# Patient Record
Sex: Male | Born: 1975 | Hispanic: Yes | Marital: Single | Smoking: Current every day smoker
Health system: Southern US, Community
[De-identification: ages and names within clinical notes are randomized; demographics above are authoritative.]

---

## 2019-10-11 ENCOUNTER — Encounter (HOSPITAL_BASED_OUTPATIENT_CLINIC_OR_DEPARTMENT_OTHER): Payer: Self-pay | Admitting: *Deleted

## 2019-10-11 ENCOUNTER — Emergency Department (HOSPITAL_BASED_OUTPATIENT_CLINIC_OR_DEPARTMENT_OTHER): Payer: Self-pay

## 2019-10-11 ENCOUNTER — Emergency Department (HOSPITAL_BASED_OUTPATIENT_CLINIC_OR_DEPARTMENT_OTHER)
Admission: EM | Admit: 2019-10-11 | Discharge: 2019-10-11 | Disposition: A | Discharge status: Home / Self Care | Payer: Self-pay | Attending: Emergency Medicine | Admitting: Emergency Medicine

## 2019-10-11 ENCOUNTER — Other Ambulatory Visit: Payer: Self-pay

## 2019-10-11 DIAGNOSIS — F17200 Nicotine dependence, unspecified, uncomplicated: Secondary | ICD-10-CM | POA: Insufficient documentation

## 2019-10-11 DIAGNOSIS — W108XXA Fall (on) (from) other stairs and steps, initial encounter: Secondary | ICD-10-CM | POA: Insufficient documentation

## 2019-10-11 DIAGNOSIS — S2231XA Fracture of one rib, right side, initial encounter for closed fracture: Secondary | ICD-10-CM | POA: Insufficient documentation

## 2019-10-11 DIAGNOSIS — Y9301 Activity, walking, marching and hiking: Secondary | ICD-10-CM | POA: Insufficient documentation

## 2019-10-11 MED ORDER — METHOCARBAMOL 500 MG PO TABS: 500.0000 mg | ORAL_TABLET | Freq: Three times a day (TID) | ORAL | 0 refills | Status: AC | PRN

## 2019-10-11 MED ORDER — HYDROCODONE-ACETAMINOPHEN 5-325 MG PO TABS
2.0000 | ORAL_TABLET | ORAL | 0 refills | Status: AC | PRN
Start: 2019-10-11 — End: ?

## 2019-10-11 MED ORDER — NAPROXEN 375 MG PO TABS: 375.0000 mg | ORAL_TABLET | Freq: Two times a day (BID) | ORAL | 0 refills | Status: AC

## 2019-10-11 MED FILL — HYDROCODON-APAP 5-325: 5-325 | 2 days supply | Qty: 10 | Fill #0

## 2019-10-11 MED FILL — METHOCARBAMOL 500 MG TABS: 500 | 6 days supply | Qty: 20 | Fill #0

## 2019-10-11 MED FILL — NAPROXEN 375 MG TABLET: 375 | 10 days supply | Qty: 20 | Fill #0

## 2019-10-11 NOTE — Discharge Instructions (Signed)
Contact a health care provider if: You have a fever. Get help right away if: You have difficulty breathing or you are short of breath. You develop a cough that does not stop, or you cough up thick or bloody sputum. You have nausea, vomiting, or pain in your abdomen. Your pain gets worse and medicine does not help. 

## 2019-10-11 NOTE — ED Notes (Addendum)
Incentive spirometry teaching, pt verbalizes understanding, demonstrated technique, teach back method

## 2019-10-11 NOTE — ED Notes (Signed)
Pt discharged to home. Discharge instructions have been discussed with patient and/or family members. Pt verbally acknowledges understanding d/c instructions, and endorses comprehension to checkout at registration before leaving.  °

## 2019-10-11 NOTE — ED Triage Notes (Signed)
Fall down stairs 2 days ago with injury to his right ribs.

## 2019-10-11 NOTE — ED Provider Notes (Signed)
[facility] EMERGENCY DEPARTMENT Provider Note   CSN: [id number] Arrival date & time: 10/11/19  1102     History Chief Complaint  Patient presents with  . Fall  . Rib Injury    Brandon Malone is a 44 y.o. male who presents emergency department with chief complaint of rib pain.  Patient states that 2 nights ago he [place] and [place] several stairs on his back.  Since that time he has had pain in his right lowe rib cage.  Patient states that since that time he has had pain when he moves, twists, or takes deep breath.  He describes the pain as sharp.  He has tried naproxen without relief.  He denies cough, head injury, loss of consciousness, hemoptysis.  HPI     History reviewed. No pertinent past medical history.  There are no problems to display for this patient.   History reviewed. No pertinent surgical history.     No family history on file.  Social History   Tobacco Use  . Smoking status: Current Every Day Smoker  . Smokeless tobacco: Never Used  Substance Use Topics  . Alcohol use: Yes  . Drug use: Never    Home Medications Prior to Admission medications   Medication Sig Start Date End Date Taking? Authorizing Provider  HYDROcodone-acetaminophen (NORCO) 5-325 MG tablet Take 2 tablets by mouth every 4 (four) hours as needed. 10/11/19   Arthor Captain, PA-C  methocarbamol (ROBAXIN) 500 MG tablet Take 1 tablet (500 mg total) by mouth 3 (three) times daily as needed for muscle spasms. 10/11/19   Johntavious Francom, Cammy Copa, PA-C  naproxen (NAPROSYN) 375 MG tablet Take 1 tablet (375 mg total) by mouth 2 (two) times daily. 10/11/19   Arthor Captain, PA-C    Allergies    Patient has no known allergies.  Review of Systems   Review of Systems Ten systems reviewed and are negative for acute change, except as noted in the HPI.   Physical Exam Updated Vital Signs Ht 5\' 10"  (1.778 m)   Wt 81.2 kg   BMI 25.68 kg/m   Physical  Exam Vitals and nursing note reviewed.  Constitutional:      General: He is not in acute distress.    Appearance: He is well-developed. He is not diaphoretic.  HENT:     Head: Normocephalic and atraumatic.  Eyes:     General: No scleral icterus.    Conjunctiva/sclera: Conjunctivae normal.  Cardiovascular:     Rate and Rhythm: Normal rate and regular rhythm.     Heart sounds: Normal heart sounds.  Pulmonary:     Effort: Pulmonary effort is normal. No respiratory distress.     Breath sounds: Normal breath sounds.  Chest:     Comments: Point tenderness over the right lower rib cage posteriorly Abdominal:     Palpations: Abdomen is soft.     Tenderness: There is no abdominal tenderness.  Musculoskeletal:     Cervical back: Normal range of motion and neck supple.  Skin:    General: Skin is warm and dry.  Neurological:     Mental Status: He is alert.  Psychiatric:        Behavior: Behavior normal.     ED Results / Procedures / Treatments   Labs (all labs ordered are listed, but only abnormal results are displayed) Labs Reviewed - No data to display  EKG None  Radiology DG Ribs Unilateral W/Chest Right  Result Date: 10/11/2019  CLINICAL DATA:  Fall 2 days ago with right lower posterior rib pain. EXAM: RIGHT RIBS AND CHEST - 3+ VIEW COMPARISON:  03/23/2017 FINDINGS: Lungs are adequately inflated and otherwise clear. Cardiomediastinal silhouette is normal. Suggestion of a nondisplaced fracture involving the right posterior tenth rib. IMPRESSION: 1. No acute cardiopulmonary disease. 2. Nondisplaced fracture right posterior tenth rib. Electronically Signed   By: Elberta Fortis M.D.   On: 10/11/2019 11:49    Procedures Procedures (including critical care time)  Medications Ordered in ED Medications - No data to display  ED Course  I have reviewed the triage vital signs and the nursing notes.  Pertinent labs & imaging results that were available during my care of the patient  were reviewed by me and considered in my medical decision making (see chart for details).    MDM Rules/Calculators/A&P                          Patient with 10th rib fracture on x-ray.  I personally reviewed the images.  Patient will be discharged with definitive fracture care including incentive spirometer, pain control.  Discussed outpatient follow-up and return precautions. Final Clinical Impression(s) / ED Diagnoses Final diagnoses:  Closed fracture of one rib of right side, initial encounter    Rx / DC Orders ED Discharge Orders         Ordered    HYDROcodone-acetaminophen (NORCO) 5-325 MG tablet  Every 4 hours PRN        10/11/19 1354    naproxen (NAPROSYN) 375 MG tablet  2 times daily        10/11/19 1354    methocarbamol (ROBAXIN) 500 MG tablet  3 times daily PRN        10/11/19 1354           Arthor Captain, PA-C 10/11/19 1410    Linwood Dibbles, MD 10/12/19 [phone]

## 2021-06-16 ENCOUNTER — Emergency Department (HOSPITAL_COMMUNITY)
Admission: EM | Admit: 2021-06-16 | Discharge: 2021-06-16 | Disposition: A | Discharge status: Home / Self Care | Payer: Self-pay | Attending: Emergency Medicine | Admitting: Emergency Medicine

## 2021-06-16 ENCOUNTER — Encounter (HOSPITAL_COMMUNITY): Payer: Self-pay

## 2021-06-16 DIAGNOSIS — S50862A Insect bite (nonvenomous) of left forearm, initial encounter: Secondary | ICD-10-CM | POA: Diagnosis not present

## 2021-06-16 DIAGNOSIS — S20169A Insect bite (nonvenomous) of breast, unspecified breast, initial encounter: Secondary | ICD-10-CM | POA: Insufficient documentation

## 2021-06-16 DIAGNOSIS — R21 Rash and other nonspecific skin eruption: Secondary | ICD-10-CM

## 2021-06-16 DIAGNOSIS — S30860A Insect bite (nonvenomous) of lower back and pelvis, initial encounter: Secondary | ICD-10-CM | POA: Insufficient documentation

## 2021-06-16 DIAGNOSIS — W57XXXA Bitten or stung by nonvenomous insect and other nonvenomous arthropods, initial encounter: Secondary | ICD-10-CM | POA: Insufficient documentation

## 2021-06-16 MED ORDER — DOXYCYCLINE HYCLATE 100 MG PO CAPS: 100.0000 mg | ORAL_CAPSULE | Freq: Two times a day (BID) | ORAL | 0 refills | Status: AC

## 2021-06-16 NOTE — ED Provider Notes (Signed)
[facility]-EMERGENCY DEPT Provider Note   CSN: [id number] Arrival date & time: 06/16/21  1401     History  Chief Complaint  Patient presents with   Insect Bite    Brandon Malone is a 46 y.o. male.  Patient presents to the hospital complaining of multiple insect bites.  Patient states that he felt bites while at work on Thursday.  Friday morning he noticed red swollen areas where he felt the bites.  He has bites on the left forearm, left side of chest, and posterior left thigh.  He denies fevers, nausea, vomiting.  The patient states that they tried to pop the lesions and noticed either clear fluid or possibly a yellowish-white fluid from the wounds.  No relevant past medical history  HPI     Home Medications Prior to Admission medications   Medication Sig Start Date End Date Taking? Authorizing Provider  doxycycline (VIBRAMYCIN) 100 MG capsule Take 1 capsule (100 mg total) by mouth 2 (two) times daily. 06/16/21  Yes Darrick Grinder, PA-C  HYDROcodone-acetaminophen (NORCO) 5-325 MG tablet Take 2 tablets by mouth every 4 (four) hours as needed. 10/11/19   Arthor Captain, PA-C  methocarbamol (ROBAXIN) 500 MG tablet Take 1 tablet (500 mg total) by mouth 3 (three) times daily as needed for muscle spasms. 10/11/19   Harris, Cammy Copa, PA-C  naproxen (NAPROSYN) 375 MG tablet Take 1 tablet (375 mg total) by mouth 2 (two) times daily. 10/11/19   Arthor Captain, PA-C      Allergies    Patient has no known allergies.    Review of Systems   Review of Systems  Constitutional:  Negative for fever.  Gastrointestinal:  Negative for nausea and vomiting.  Skin:  Positive for rash.   Physical Exam Updated Vital Signs BP (!) 147/88 (BP Location: Right Arm)   Pulse 81   Temp 97.7 F (36.5 C) (Oral)   Resp 18   Ht 5\' 9"  (1.753 m)   Wt 83.9 kg   SpO2 99%   BMI 27.32 kg/m  Physical Exam Vitals and nursing note reviewed.  Constitutional:      General: He is not in  acute distress. HENT:     Head: Normocephalic and atraumatic.  Eyes:     Conjunctiva/sclera: Conjunctivae normal.  Cardiovascular:     Rate and Rhythm: Normal rate.  Pulmonary:     Effort: Pulmonary effort is normal.  Musculoskeletal:     Cervical back: Normal range of motion and neck supple.  Skin:    Findings: Lesion present.     Comments: Patient has erythematous lesion this.  1 on the left forearm, 1 in the left side of the chest near the nipple, and 2 on the posterior left buttock.  Lesions appear approximately 2.5 cm in size with punctate centers.  No drainage at this time  Neurological:     Mental Status: He is alert.    ED Results / Procedures / Treatments   Labs (all labs ordered are listed, but only abnormal results are displayed) Labs Reviewed - No data to display  EKG None  Radiology No results found.  Procedures Procedures    Medications Ordered in ED Medications - No data to display  ED Course/ Medical Decision Making/ A&P                           Medical Decision Making  Patient presents with concerns of insect bites with resultant  skin lesions.  Differential includes infected insect bites, cellulitis, abscess, ingrown hairs, and others  Ultrasound was used to evaluate lesions for possible drainage.  Minimal fluid collection, consider drainage but risk versus benefit does not seem worth incision.  Plan to treat this underlying infection with doxycycline.  The patient should use warm compresses at home.  We will schedule follow-up with your primary care urgent care within the next week for reevaluation.  Patient voices understanding with the plan and agrees.  Discharge home  Final Clinical Impression(s) / ED Diagnoses Final diagnoses:  Rash  Insect bite, unspecified site, initial encounter    Rx / DC Orders ED Discharge Orders          Ordered    doxycycline (VIBRAMYCIN) 100 MG capsule  2 times daily        06/16/21 1601               Pamala Duffel 06/16/21 1603    Mancel Bale, MD 06/16/21 2246

## 2021-06-16 NOTE — Discharge Instructions (Signed)
You were seen today due to a rash from possible insect bites.  As discussed, I have provided a prescription for an antibiotic.  Also recommend using warm compresses.  You should follow-up with a primary care provider or urgent care within the next week

## 2021-06-16 NOTE — ED Triage Notes (Signed)
Pt states that he noticed multiple insect bites that popped up after eating outside on Thursday. Pt has raised red spot on L forearm, L chest, L hamstring, L buttocks. Pt endorses drainage and itching from sites.

## 2021-12-16 IMAGING — CR DG RIBS W/ CHEST 3+V*R*
3 series · 3 of 3 positions shown · non-contrast
Comparison: 03/23/2017

CLINICAL DATA: Fall 2 days ago with right lower posterior rib pain.

EXAM:
RIGHT RIBS AND CHEST - 3+ VIEW

[w chest pa]
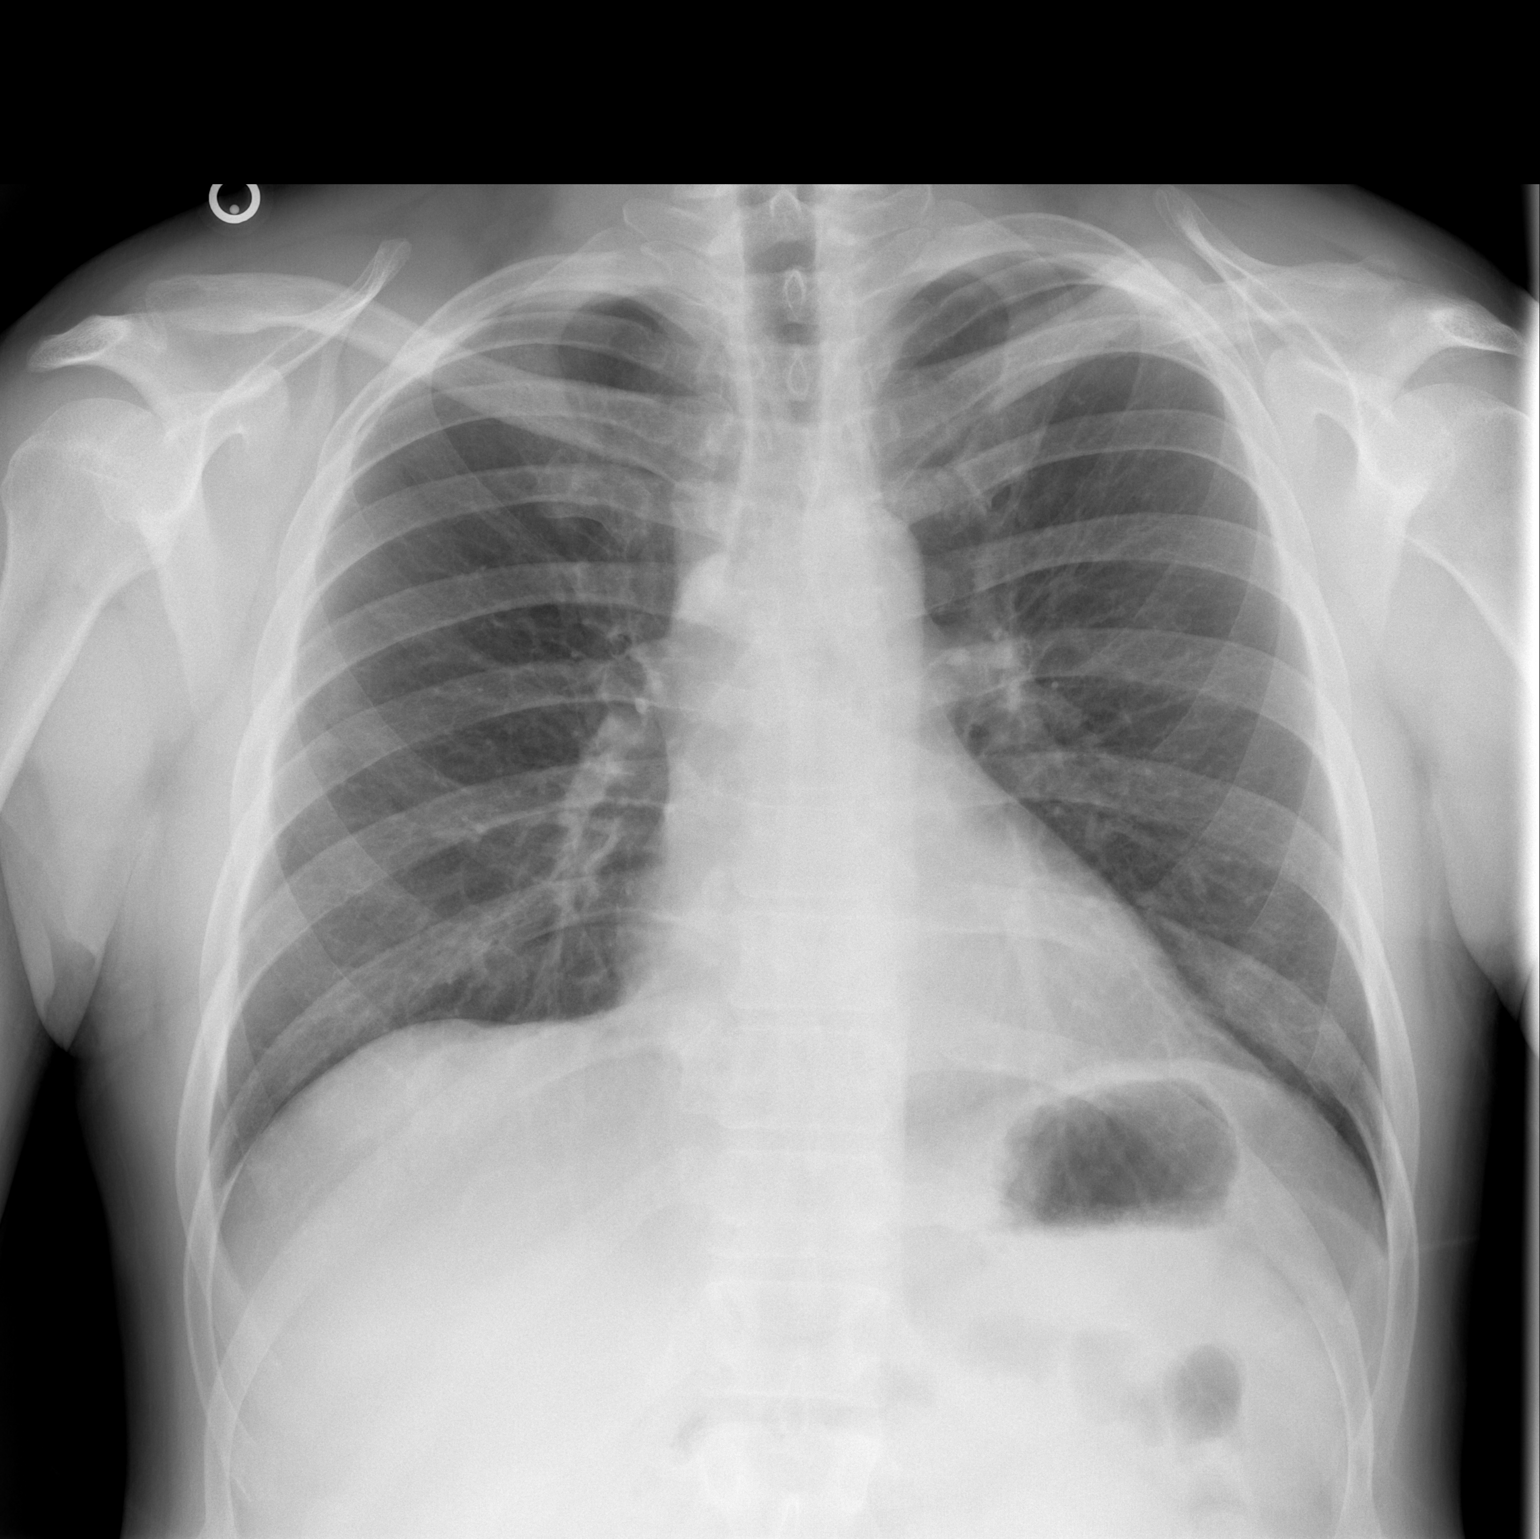

[w ribs ap/pa lower right *]
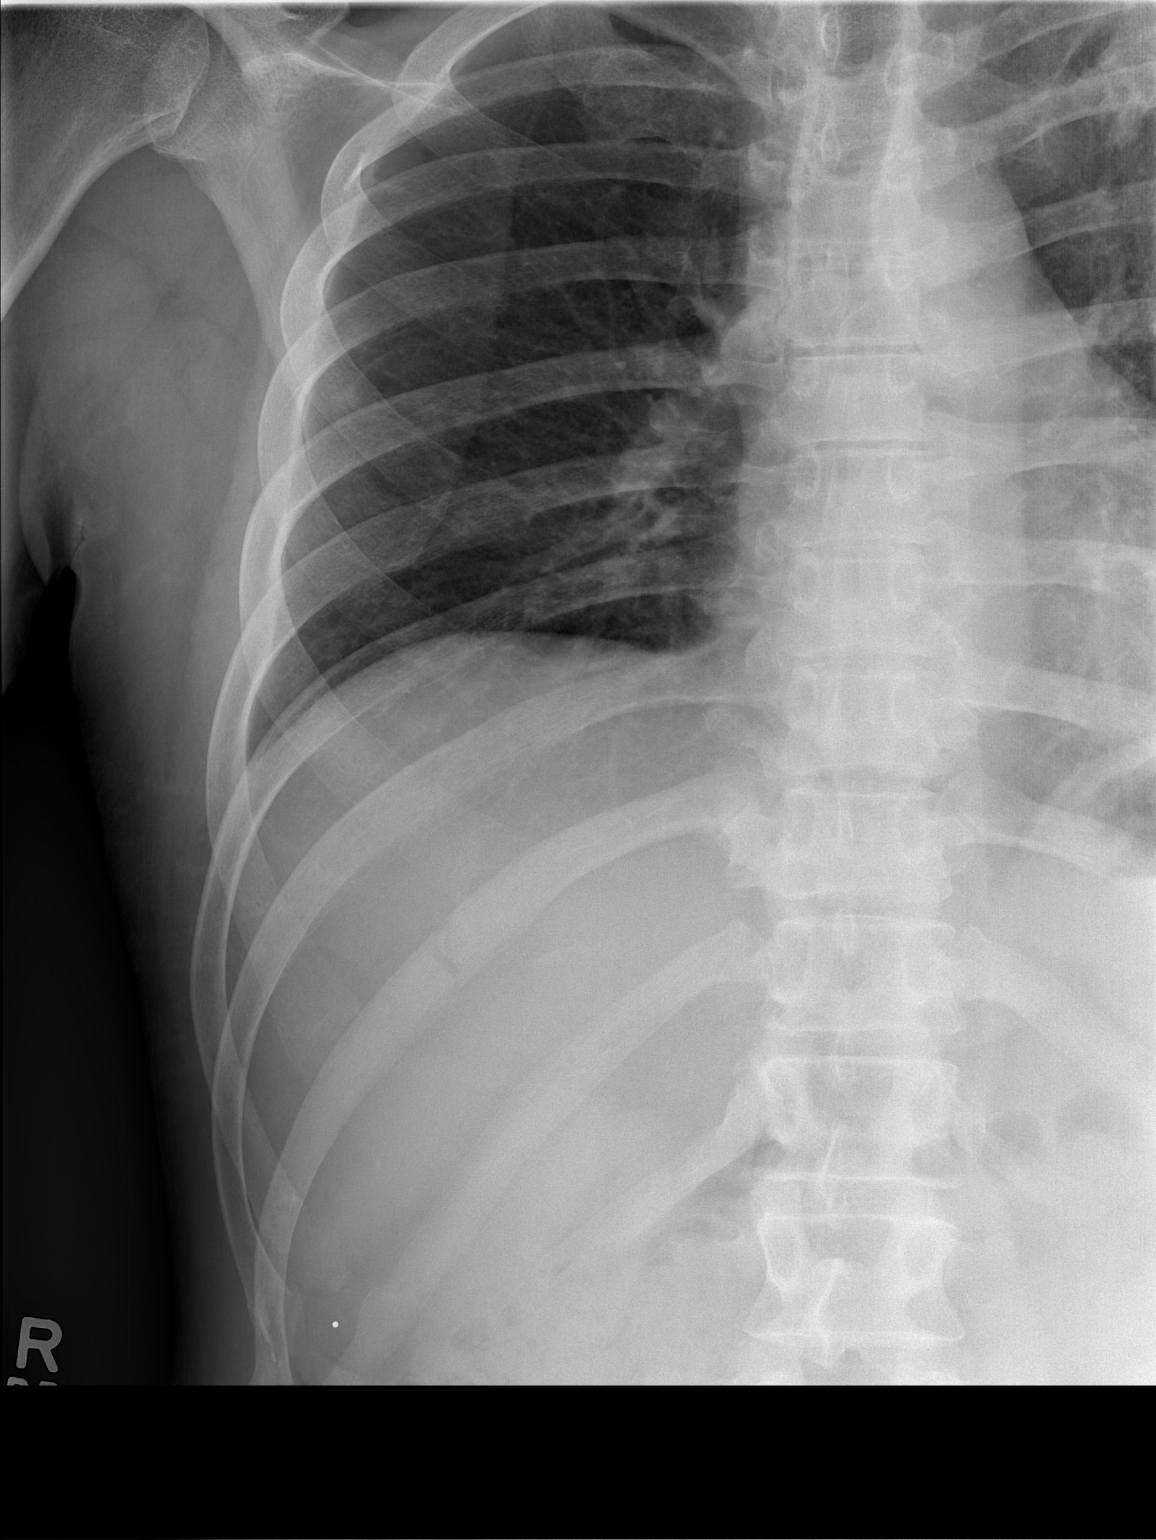

[w ribs oblique right *]
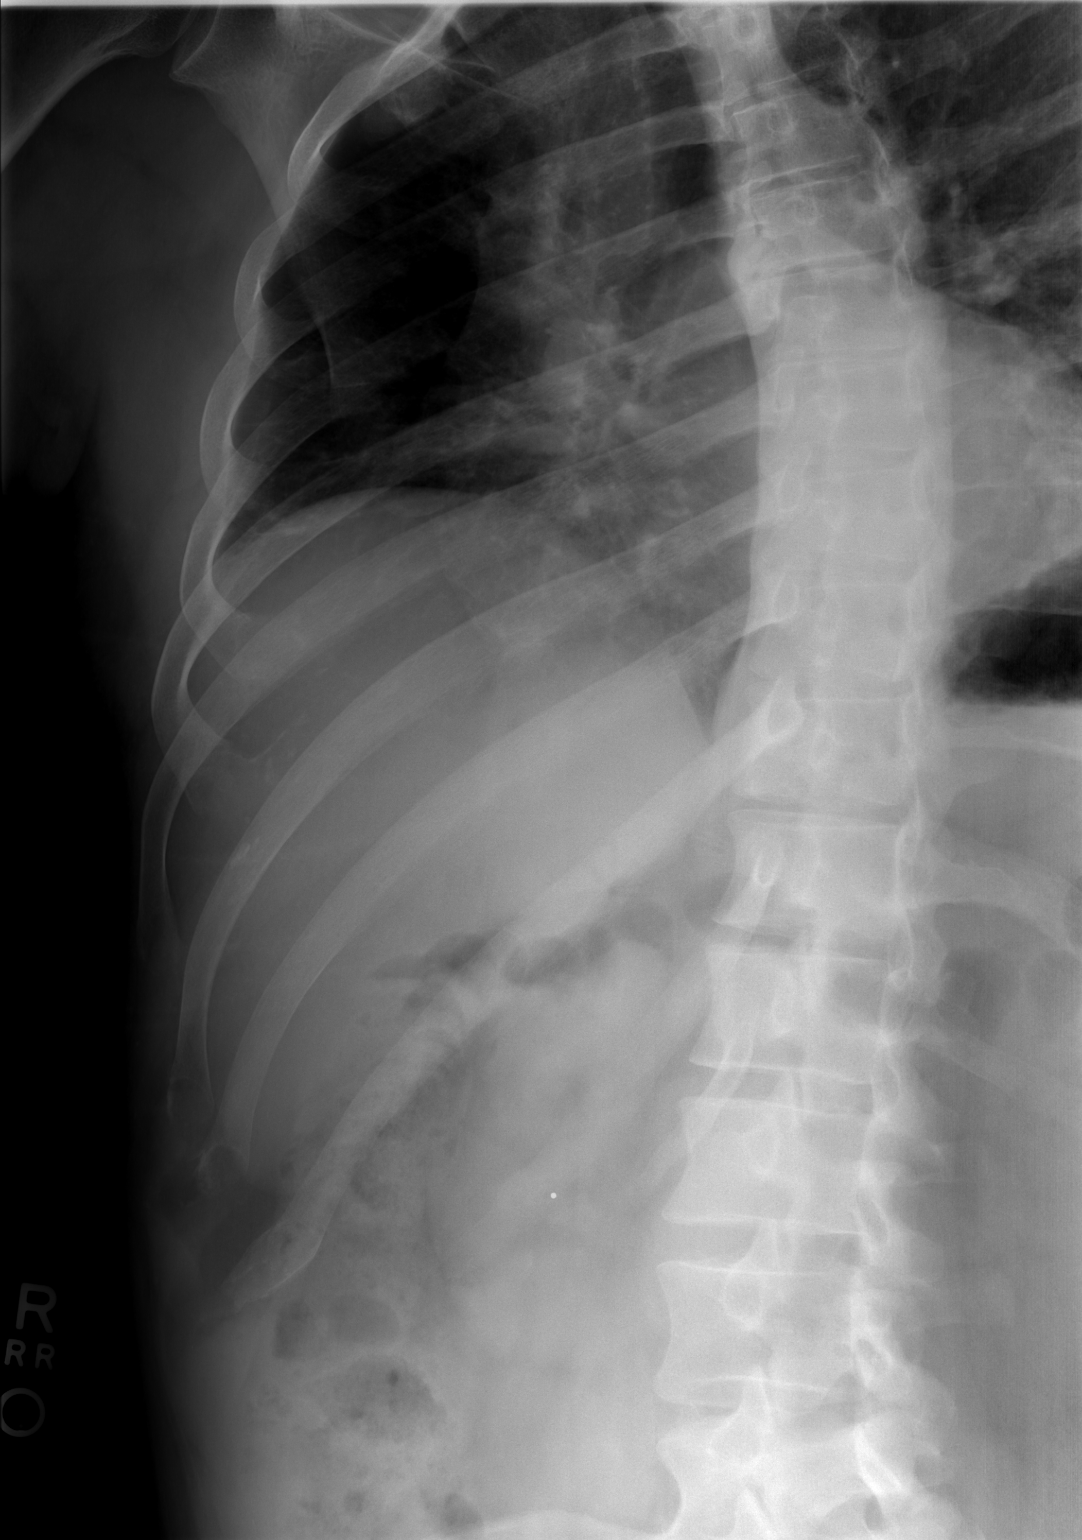

[3 of 3 positions shown; findings below may reference images not displayed]

FINDINGS: Lungs are adequately inflated and otherwise clear. Cardiomediastinal
silhouette is normal. Suggestion of a nondisplaced fracture
involving the right posterior tenth rib.
IMPRESSION: 1. No acute cardiopulmonary disease.
2. Nondisplaced fracture right posterior tenth rib.

## 2022-10-11 ENCOUNTER — Encounter (HOSPITAL_COMMUNITY): Payer: Self-pay

## 2022-10-11 ENCOUNTER — Emergency Department (HOSPITAL_COMMUNITY)
Admission: EM | Admit: 2022-10-11 | Discharge: 2022-10-11 | Discharge status: Against Medical Advice | Payer: Commercial Managed Care - HMO | Attending: Emergency Medicine | Admitting: Emergency Medicine

## 2022-10-11 DIAGNOSIS — Y92410 Unspecified street and highway as the place of occurrence of the external cause: Secondary | ICD-10-CM | POA: Diagnosis not present

## 2022-10-11 DIAGNOSIS — S01552A Open bite of oral cavity, initial encounter: Secondary | ICD-10-CM | POA: Diagnosis present

## 2022-10-11 DIAGNOSIS — R079 Chest pain, unspecified: Secondary | ICD-10-CM | POA: Diagnosis not present

## 2022-10-11 DIAGNOSIS — Z5321 Procedure and treatment not carried out due to patient leaving prior to being seen by health care provider: Secondary | ICD-10-CM | POA: Insufficient documentation

## 2022-10-11 LAB — CBC WITH DIFFERENTIAL/PLATELET
Abs Immature Granulocytes: 0.04 10*3/uL (ref 0.00–0.07)
Basophils Absolute: 0 10*3/uL (ref 0.0–0.1)
Basophils Relative: 0 %
Eosinophils Absolute: 0 10*3/uL (ref 0.0–0.5)
Eosinophils Relative: 0 %
HCT: 50.5 % (ref 39.0–52.0)
Hemoglobin: 16.8 g/dL (ref 13.0–17.0)
Immature Granulocytes: 0 %
Lymphocytes Relative: 10 %
Lymphs Abs: 1.1 10*3/uL (ref 0.7–4.0)
MCH: 29.3 pg (ref 26.0–34.0)
MCHC: 33.3 g/dL (ref 30.0–36.0)
MCV: 88 fL (ref 80.0–100.0)
Monocytes Absolute: 0.9 10*3/uL (ref 0.1–1.0)
Monocytes Relative: 8 %
Neutro Abs: 8.7 10*3/uL — ABNORMAL HIGH (ref 1.7–7.7)
Neutrophils Relative %: 82 %
Platelets: 384 10*3/uL (ref 150–400)
RBC: 5.74 MIL/uL (ref 4.22–5.81)
RDW: 14.6 % (ref 11.5–15.5)
WBC: 10.8 10*3/uL — ABNORMAL HIGH (ref 4.0–10.5)
nRBC: 0 % (ref 0.0–0.2)

## 2022-10-11 MED ORDER — ACETAMINOPHEN 500 MG PO TABS
1000.0000 mg | ORAL_TABLET | Freq: Once | ORAL | Status: AC
  Administered 2022-10-11: 1000 mg via ORAL
  Filled 2022-10-11: qty 2
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
